# Patient Record
Sex: Female | Born: 1970 | Race: Black or African American | Hispanic: No | Marital: Single | Smoking: Never smoker
Health system: Southern US, Community
[De-identification: ages and names within clinical notes are randomized; demographics above are authoritative.]

## PROBLEM LIST (undated history)

## (undated) HISTORY — PX: CHOLECYSTECTOMY: SHX55

## (undated) HISTORY — PX: ABDOMINAL HYSTERECTOMY: SHX81

---

## 2016-04-20 ENCOUNTER — Other Ambulatory Visit: Payer: Self-pay | Admitting: Physical Medicine and Rehabilitation

## 2016-04-20 ENCOUNTER — Ambulatory Visit
Admission: RE | Admit: 2016-04-20 | Discharge: 2016-04-20 | Disposition: A | Discharge status: Home / Self Care | Payer: No Typology Code available for payment source | Source: Ambulatory Visit | Attending: Physical Medicine and Rehabilitation | Admitting: Physical Medicine and Rehabilitation

## 2016-04-20 DIAGNOSIS — Z0000 Encounter for general adult medical examination without abnormal findings: Secondary | ICD-10-CM

## 2017-10-18 ENCOUNTER — Emergency Department: Payer: 59

## 2017-10-18 ENCOUNTER — Emergency Department
Admission: EM | Admit: 2017-10-18 | Discharge: 2017-10-18 | Disposition: A | Discharge status: Home / Self Care | Payer: 59 | Attending: Emergency Medicine | Admitting: Emergency Medicine

## 2017-10-18 ENCOUNTER — Encounter: Payer: Self-pay | Admitting: Emergency Medicine

## 2017-10-18 DIAGNOSIS — R079 Chest pain, unspecified: Secondary | ICD-10-CM | POA: Diagnosis not present

## 2017-10-18 DIAGNOSIS — R0602 Shortness of breath: Secondary | ICD-10-CM | POA: Insufficient documentation

## 2017-10-18 LAB — CBC
HCT: 39.9 % (ref 35.0–47.0)
Hemoglobin: 13.4 g/dL (ref 12.0–16.0)
MCH: 29.5 pg (ref 26.0–34.0)
MCHC: 33.5 g/dL (ref 32.0–36.0)
MCV: 88.2 fL (ref 80.0–100.0)
Platelets: 266 10*3/uL (ref 150–440)
RBC: 4.53 MIL/uL (ref 3.80–5.20)
RDW: 13.5 % (ref 11.5–14.5)
WBC: 5.1 10*3/uL (ref 3.6–11.0)

## 2017-10-18 LAB — BASIC METABOLIC PANEL
ANION GAP: 9 (ref 5–15)
BUN: 13 mg/dL (ref 6–20)
CALCIUM: 9.3 mg/dL (ref 8.9–10.3)
CO2: 24 mmol/L (ref 22–32)
Chloride: 104 mmol/L (ref 101–111)
Creatinine, Ser: 0.87 mg/dL (ref 0.44–1.00)
Glucose, Bld: 92 mg/dL (ref 65–99)
POTASSIUM: 4.1 mmol/L (ref 3.5–5.1)
Sodium: 137 mmol/L (ref 135–145)

## 2017-10-18 LAB — TROPONIN I: Troponin I: <0.03 ng/mL (ref <0.03)

## 2017-10-18 MED ORDER — NITROGLYCERIN 0.4 MG SL SUBL
0.4000 mg | SUBLINGUAL_TABLET | Freq: Once | SUBLINGUAL | Status: AC
  Administered 2017-10-18: 0.4 mg via SUBLINGUAL
  Filled 2017-10-18: qty 1

## 2017-10-18 NOTE — ED Triage Notes (Signed)
Pt arrived with complaints of left sided chest pain that patient describes as a constant ache. Pt states she has had this pain for a month and due to a strong family history of heart problem came in for an evaluation.

## 2017-10-18 NOTE — ED Notes (Signed)
Pt presents today for the chest pain that has been there for 1 mth. Off and on pt states. Pt says it will come on when she is sitting still and when she is moving.Pt states it goes under the left breast and across the chest. EDP at bedside.

## 2017-10-18 NOTE — ED Provider Notes (Signed)
[facility] Emergency Department Provider Note  ____________________________________________   First MD Initiated Contact with Patient 10/18/17 1522     (approximate)  I have reviewed the triage vital signs and the nursing notes.   HISTORY  Chief Complaint Chest Pain   HPI Betty Mitchell is a 47 y.o. female with a family history of CAD who is presenting to the emergency department today with 1 month of intermittent chest pain.  Says the pain feels like a pressure and is a 7 out of 10 across the front of her chest at this time.  She says that the pain sometimes radiates into the left upper extremity and also to her bilateral shoulders.  She says the pain usually last 5-10 minutes at a time and is not necessarily associated with the shortness of breath.  She says that she does not get the pain with exertion and that it comes on randomly when she is sometimes at rest.  Says that the worst that the pain is a 9 out of 10 and sharp.  She denies any nausea or vomiting.  Does not report any diaphoresis.  Says that her family members were in their 70s when they required bypass surgery.  Patient denies smoking, drinking or drug use.  Says that she also has been generally fatigued over the past month.  Also reports a stress test that, according to care everywhere, was in 2016, and was normal.    History reviewed. No pertinent past medical history.  There are no active problems to display for this patient.   Past Surgical History:  Procedure Laterality Date  . ABDOMINAL HYSTERECTOMY    . CHOLECYSTECTOMY      Prior to Admission medications   Not on File    Allergies Dilaudid [hydromorphone hcl]  No family history on file.  Social History Social History   Tobacco Use  . Smoking status: Never Smoker  . Smokeless tobacco: Never Used  Substance Use Topics  . Alcohol use: Yes  . Drug use: No    Review of Systems  Constitutional: No fever/chills Eyes: No  visual changes. ENT: No sore throat. Cardiovascular: as above Respiratory: as above Gastrointestinal: No abdominal pain.  No nausea, no vomiting.  No diarrhea.  No constipation. Genitourinary: Negative for dysuria. Musculoskeletal: Negative for back pain. Skin: Negative for rash. Neurological: Negative for headaches, focal weakness or numbness.   ____________________________________________   PHYSICAL EXAM:  VITAL SIGNS: ED Triage Vitals  Enc Vitals Group     BP 10/18/17 1456 112/67     Pulse Rate 10/18/17 1456 (!) 58     Resp 10/18/17 1456 16     Temp 10/18/17 1456 98.4 F (36.9 C)     Temp Source 10/18/17 1456 Oral     SpO2 10/18/17 1456 100 %     Weight 10/18/17 1457 170 lb (77.1 kg)     Height 10/18/17 1457 5\' 1"  (1.549 m)     Head Circumference --      Peak Flow --      Pain Score 10/18/17 1457 7     Pain Loc --      Pain Edu? --      Excl. in GC? --     Constitutional: Alert and oriented. Well appearing and in no acute distress. Eyes: Conjunctivae are normal.  Head: Atraumatic. Nose: No congestion/rhinnorhea. Mouth/Throat: Mucous membranes are moist.  Neck: No stridor.   Cardiovascular: Normal rate, regular rhythm. Grossly normal heart sounds.  Good  peripheral circulation with equal and bilateral radial as well as dorsalis pedis pulses.  No reproducible tenderness to palpation over the chest wall. Respiratory: Normal respiratory effort.  No retractions. Lungs CTAB. Gastrointestinal: Soft and nontender. No distention.  Musculoskeletal: No lower extremity tenderness nor edema.  No joint effusions. Neurologic:  Normal speech and language. No gross focal neurologic deficits are appreciated. Skin:  Skin is warm, dry and intact. No rash noted. Psychiatric: Mood and affect are normal. Speech and behavior are normal.  ____________________________________________   LABS (all labs ordered are listed, but only abnormal results are displayed)  Labs Reviewed  BASIC  METABOLIC PANEL  CBC  TROPONIN I   ____________________________________________  EKG  ED ECG REPORT I, Arelia Longest, the attending physician, personally viewed and interpreted this ECG.   Date: 10/18/2017  EKG Time: 1452  Rate: 61  Rhythm: normal sinus rhythm  Axis: Normal  Intervals:none  ST&T Change: No ST segment elevation or depression.  No abnormal T wave inversion  ED ECG REPORT I, Arelia Longest, the attending physician, personally viewed and interpreted this ECG.   Date: 10/18/2017  EKG Time: 1452  Rate: 63  Rhythm: normal sinus rhythm  Axis: Normal  Intervals:none  ST&T Change: No ST segment elevation or depression.  No abnormal T wave inversion.   ____________________________________________  RADIOLOGY  No acute disease on the chest x-ray ____________________________________________   PROCEDURES  Procedure(s) performed:   Procedures  Critical Care performed:   ____________________________________________   INITIAL IMPRESSION / ASSESSMENT AND PLAN / ED COURSE  Pertinent labs & imaging results that were available during my care of the patient were reviewed by me and considered in my medical decision making (see chart for details).  Differential diagnosis includes, but is not limited to, ACS, aortic dissection, pulmonary embolism, cardiac tamponade, pneumothorax, pneumonia, pericarditis, myocarditis, GI-related causes including esophagitis/gastritis, and musculoskeletal chest wall pain.   As part of my medical decision making, I reviewed the following data within the electronic MEDICAL RECORD NUMBER Old chart reviewed and Notes from prior ED visits   ----------------------------------------- 4:20 PM on 10/18/2017 -----------------------------------------  Patient after nitro tab says that the tab mostly just "made her feel weird."  She says that it made her lightheaded and short of breath with a headache.  However, she still says that her chest  pressure is a 5 out of 10.  Very reassuring workup.  Nonexertional chest pain that is not relieved by nitroglycerin.  Patient says that the stress test that she took in 2016 was "normal."  She will be following up with her primary care doctor but I also told her I will be giving her the number for cardiology as it will be essential for her to follow-up within the next 48-72 hours in the office.  She says that she will call this afternoon to schedule follow-up appointments.  We also discussed return to the hospital for any worsening or concerning symptoms and she is understanding and willing to comply.  The patient says that she does take estradiol so this does make her a bit increased risk for pulmonary embolus.  However, she is had very reassuring vital signs.  No pleuritic chest pain.  No lower extremity edema.  Clinical presentation is overall inconsistent with pulmonary embolus.  Also with a heart score of 2.      ____________________________________________   FINAL CLINICAL IMPRESSION(S) / ED DIAGNOSES  Chest pain.  Shortness of breath.   NEW MEDICATIONS STARTED DURING THIS VISIT:  New Prescriptions   No medications on file     Note:  This document was prepared using Dragon voice recognition software and may include unintentional dictation errors.     Myrna BlazerSchaevitz, Joniah Bednarski Matthew, MD 10/18/17 [id number]

## 2018-07-10 NOTE — Progress Notes (Deleted)
Last pap  Mammogram   

## 2018-07-18 ENCOUNTER — Other Ambulatory Visit: Payer: Self-pay | Admitting: Physician Assistant

## 2018-07-18 ENCOUNTER — Encounter: Payer: 59 | Admitting: Obstetrics and Gynecology

## 2018-07-18 ENCOUNTER — Other Ambulatory Visit: Payer: Self-pay | Admitting: Psychiatry

## 2018-07-18 DIAGNOSIS — Z1231 Encounter for screening mammogram for malignant neoplasm of breast: Secondary | ICD-10-CM

## 2018-08-06 ENCOUNTER — Ambulatory Visit
Admission: RE | Admit: 2018-08-06 | Discharge: 2018-08-06 | Disposition: A | Discharge status: Home / Self Care | Payer: 59 | Source: Ambulatory Visit | Attending: Physician Assistant | Admitting: Physician Assistant

## 2018-08-06 DIAGNOSIS — Z1231 Encounter for screening mammogram for malignant neoplasm of breast: Secondary | ICD-10-CM

## 2018-08-07 ENCOUNTER — Other Ambulatory Visit: Payer: Self-pay | Admitting: Physician Assistant

## 2018-08-07 DIAGNOSIS — R928 Other abnormal and inconclusive findings on diagnostic imaging of breast: Secondary | ICD-10-CM

## 2018-08-09 ENCOUNTER — Ambulatory Visit
Admission: RE | Admit: 2018-08-09 | Discharge: 2018-08-09 | Disposition: A | Discharge status: Home / Self Care | Payer: 59 | Source: Ambulatory Visit | Attending: Physician Assistant | Admitting: Physician Assistant

## 2018-08-09 DIAGNOSIS — R928 Other abnormal and inconclusive findings on diagnostic imaging of breast: Secondary | ICD-10-CM

## 2019-07-07 ENCOUNTER — Other Ambulatory Visit: Payer: Self-pay | Admitting: Orthopaedic Surgery

## 2019-07-07 DIAGNOSIS — Z1231 Encounter for screening mammogram for malignant neoplasm of breast: Secondary | ICD-10-CM

## 2019-08-26 ENCOUNTER — Ambulatory Visit
Admission: RE | Admit: 2019-08-26 | Discharge: 2019-08-26 | Disposition: A | Discharge status: Home / Self Care | Payer: 59 | Source: Ambulatory Visit | Attending: Orthopaedic Surgery | Admitting: Orthopaedic Surgery

## 2019-08-26 ENCOUNTER — Other Ambulatory Visit: Payer: Self-pay

## 2019-08-26 DIAGNOSIS — Z1231 Encounter for screening mammogram for malignant neoplasm of breast: Secondary | ICD-10-CM

## 2019-10-28 IMAGING — MG DIGITAL DIAGNOSTIC UNILATERAL RIGHT MAMMOGRAM WITH TOMO AND CAD
4 series · 4 of 12 positions shown · non-contrast
Comparison: Previous exam(s).

CLINICAL DATA: Patient recalled from screening for right breast
mass.

EXAM:
DIGITAL DIAGNOSTIC RIGHT MAMMOGRAM WITH CAD AND TOMO
ULTRASOUND RIGHT BREAST

[R ML synth-2D]
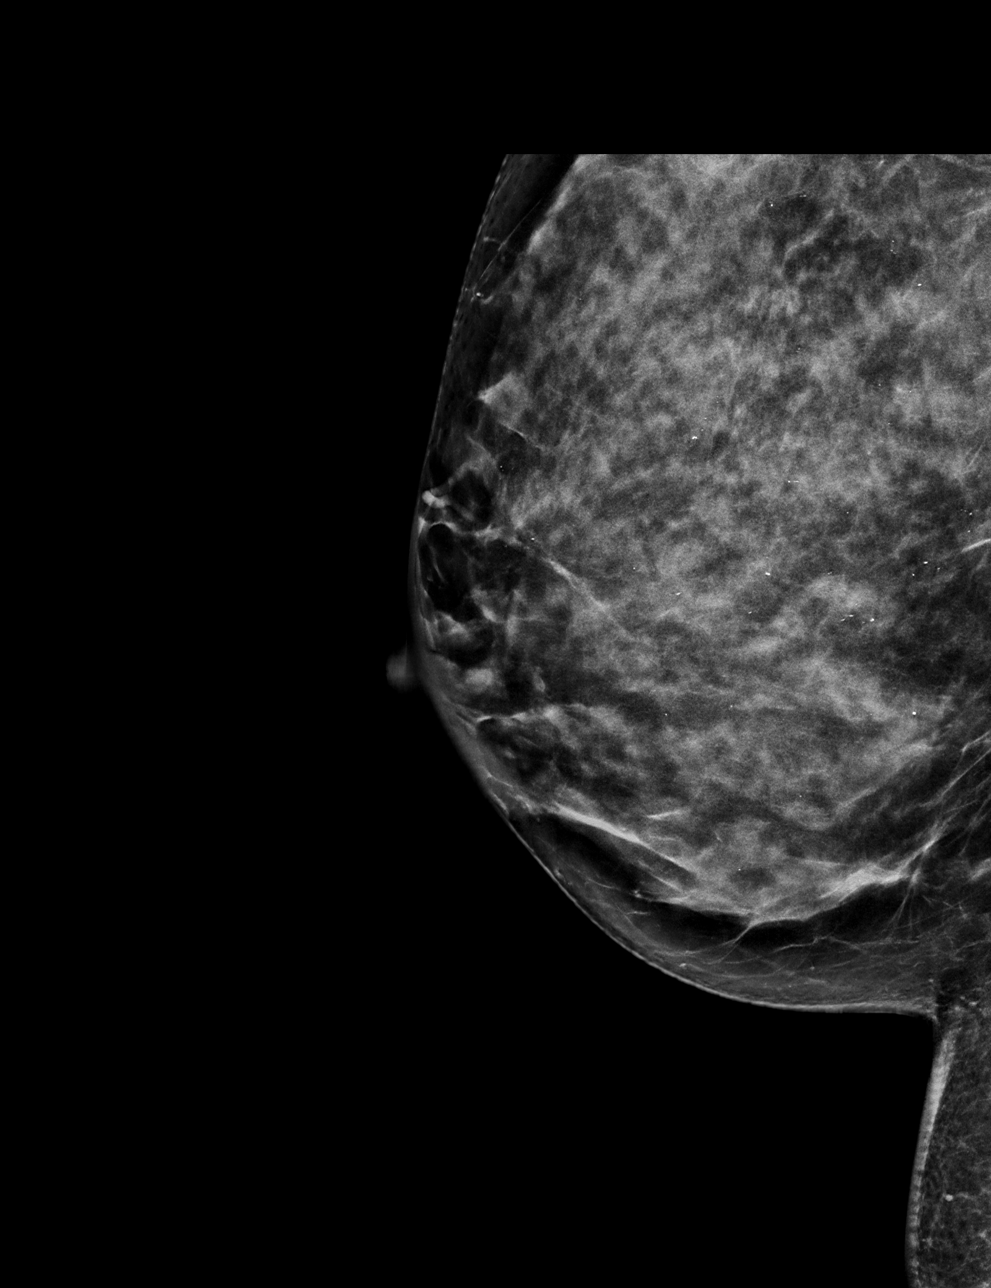

[R CC synth-2D]
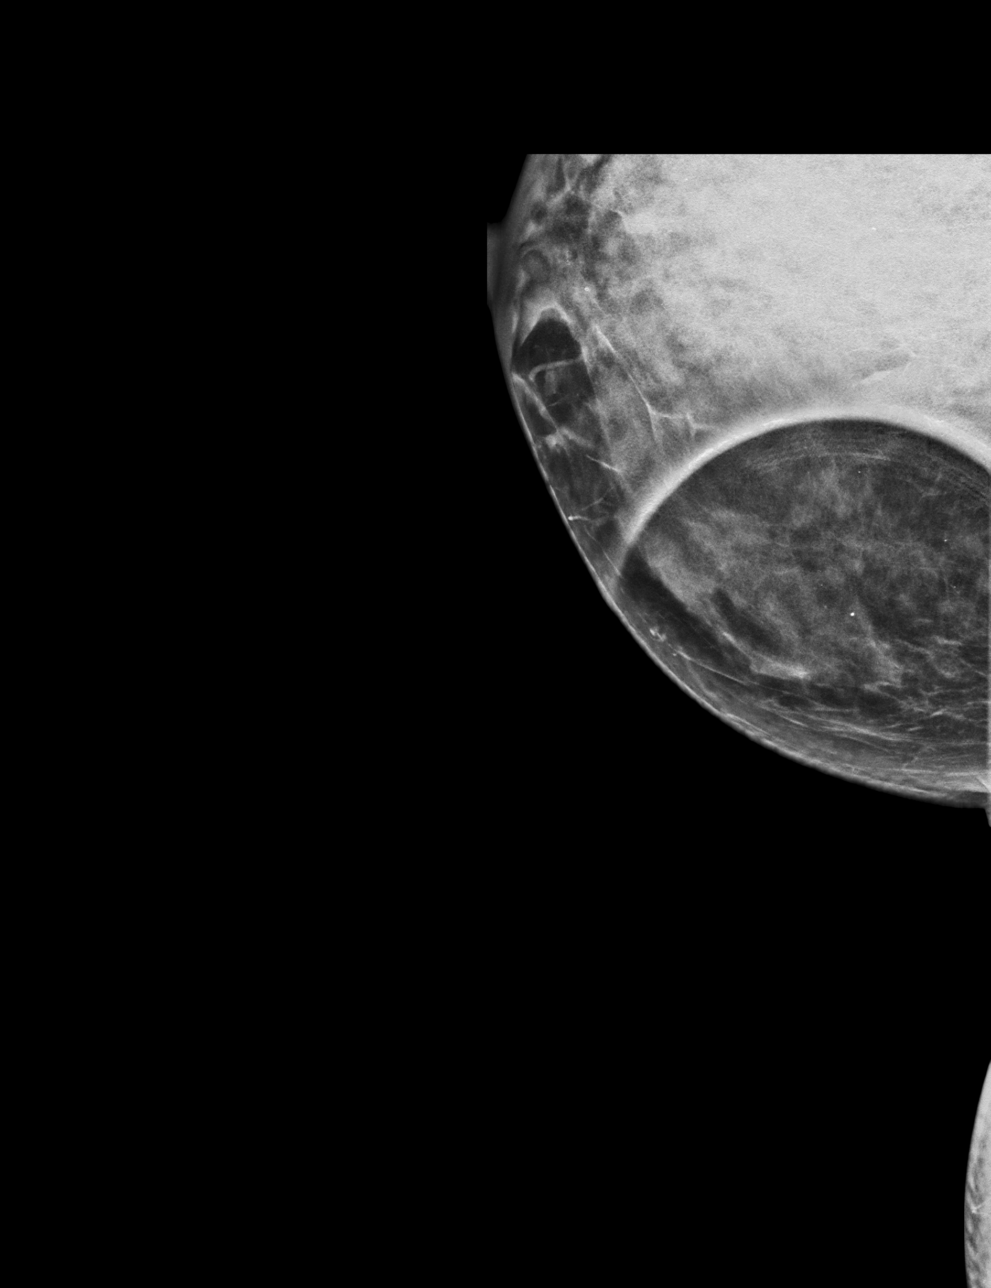

[R CC tomo · tomo slice 25/49.0]
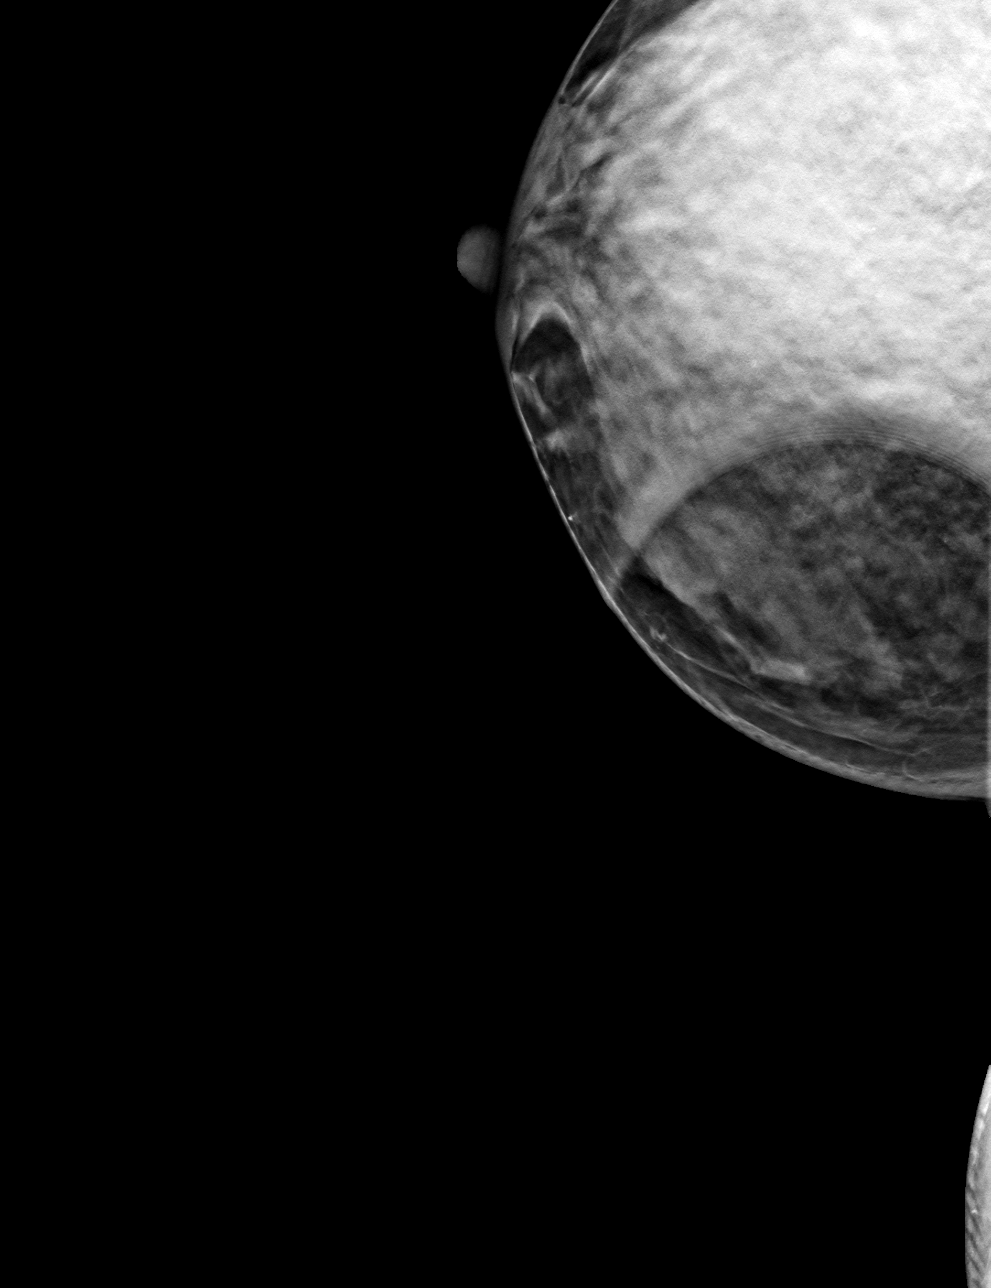

[R ML tomo · tomo slice 31/61.0]
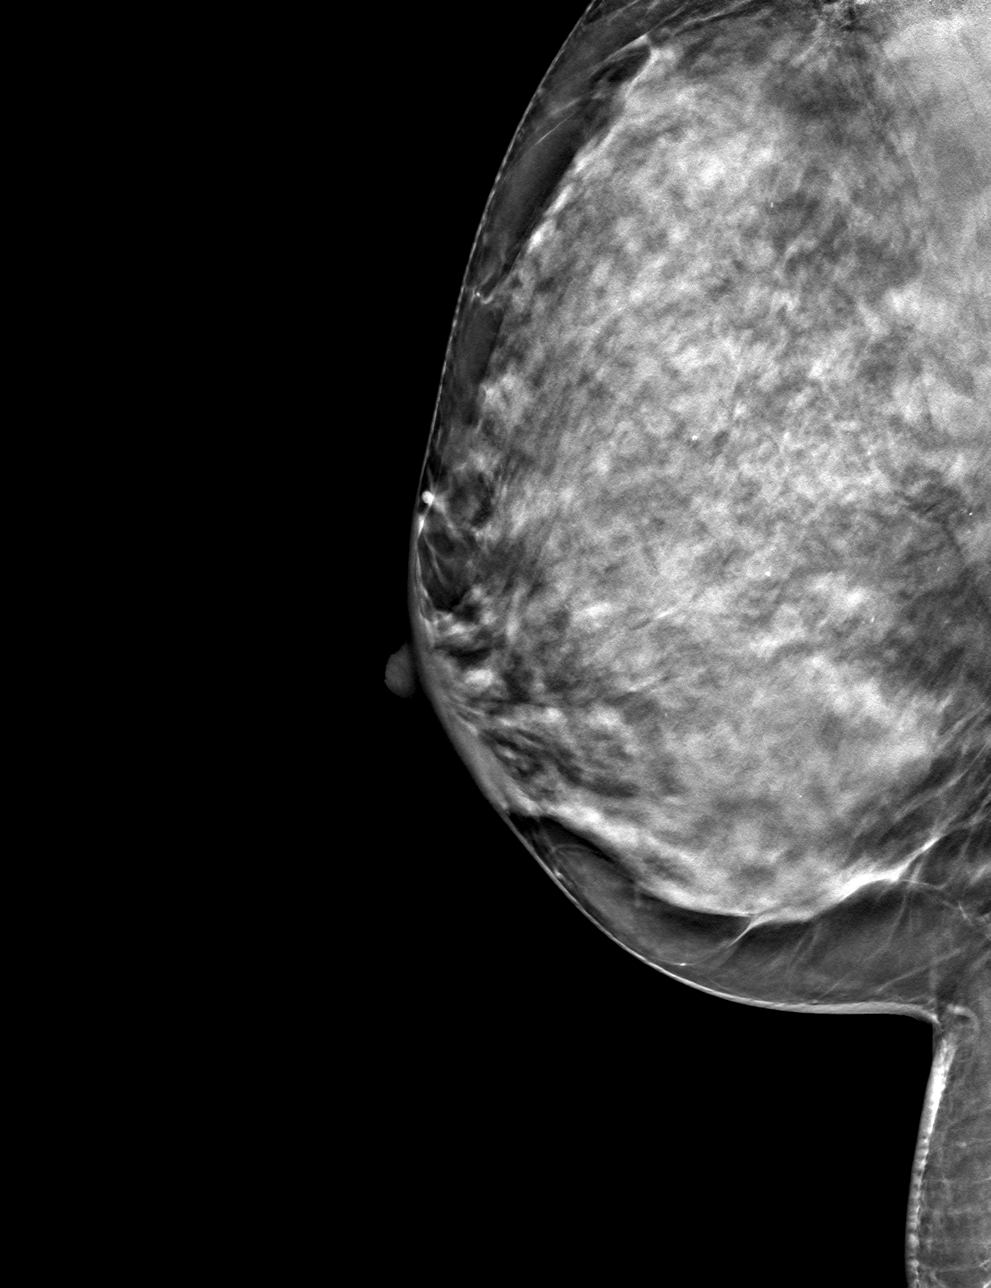

[4 of 12 positions shown; findings below may reference images not displayed]

ACR Breast Density Category d: The breast tissue is extremely dense,
which lowers the sensitivity of mammography.
FINDINGS: Spot-compression CC tomosynthesis images of the right breast
demonstrate the questioned mass to efface within the medial right
breast. There is residual dense tissue in this location.

Mammographic images were processed with CAD.

On physical exam, I palpate no discrete mass within the medial right
breast.

Targeted ultrasound is performed, showing dense tissue within the
medial right breast. Additionally there is a 5 x 3 x 7 mm cyst right
breast 2 o'clock position 5 cm from nipple, felt to correspond with
mammographically identified mass.
IMPRESSION: Right breast cyst.

No mammographic evidence for malignancy.

RECOMMENDATION:
Screening mammogram in one year.(Code:X0-M-PED)

I have discussed the findings and recommendations with the patient.
Results were also provided in writing at the conclusion of the
visit. If applicable, a reminder letter will be sent to the patient
regarding the next appointment.

BI-RADS CATEGORY  2: Benign.

## 2019-10-28 IMAGING — US ULTRASOUND RIGHT BREAST LIMITED
1 series · 4 of 4 positions shown · non-contrast
Comparison: Previous exam(s).

CLINICAL DATA: Patient recalled from screening for right breast
mass.

EXAM:
DIGITAL DIAGNOSTIC RIGHT MAMMOGRAM WITH CAD AND TOMO
ULTRASOUND RIGHT BREAST

[Series 1: ultrasound right breast limited · 0.06mm/px · 4 of 4 slices shown]
[im 1/4]
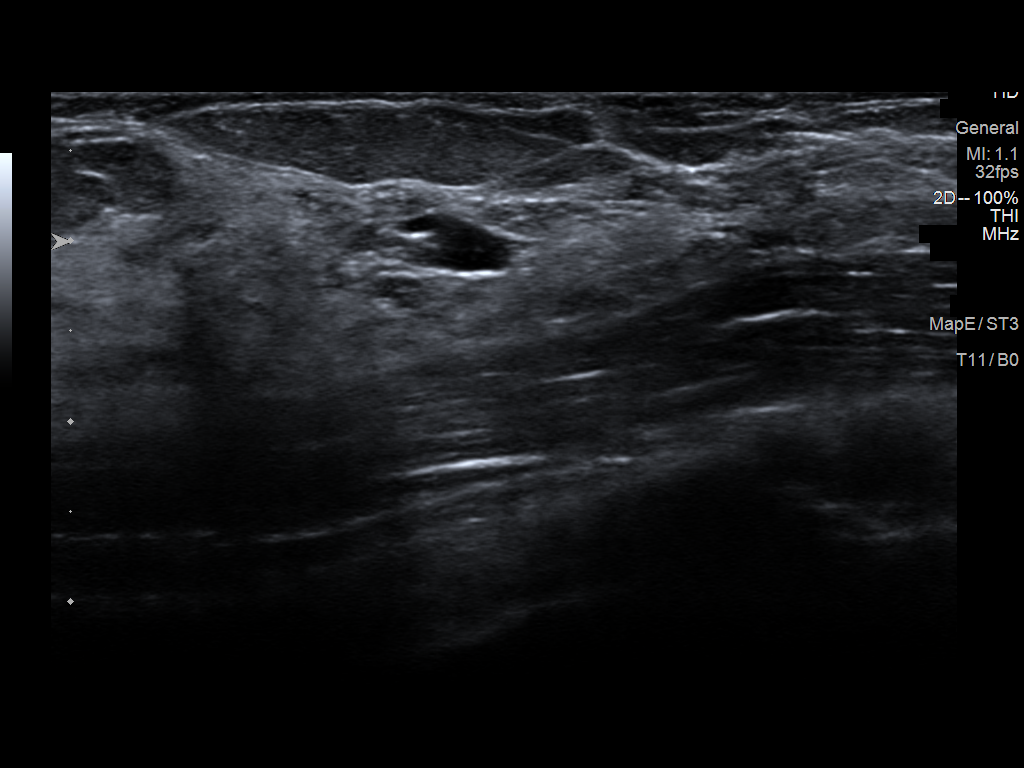
[im 2/4]
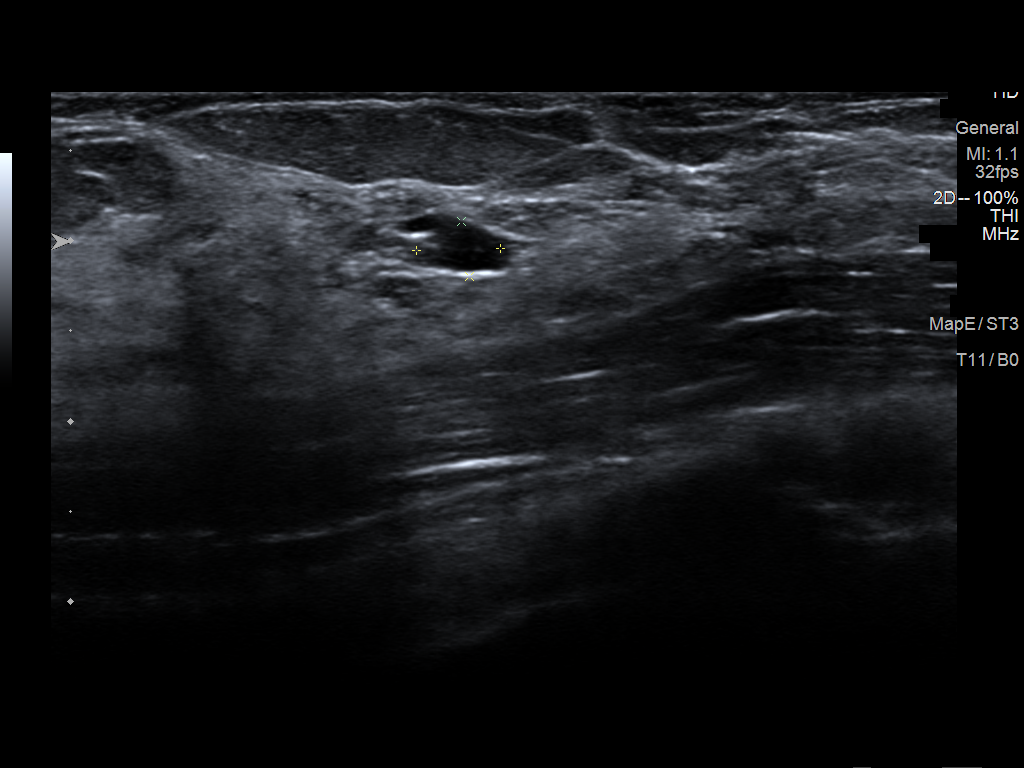
[im 3/4]
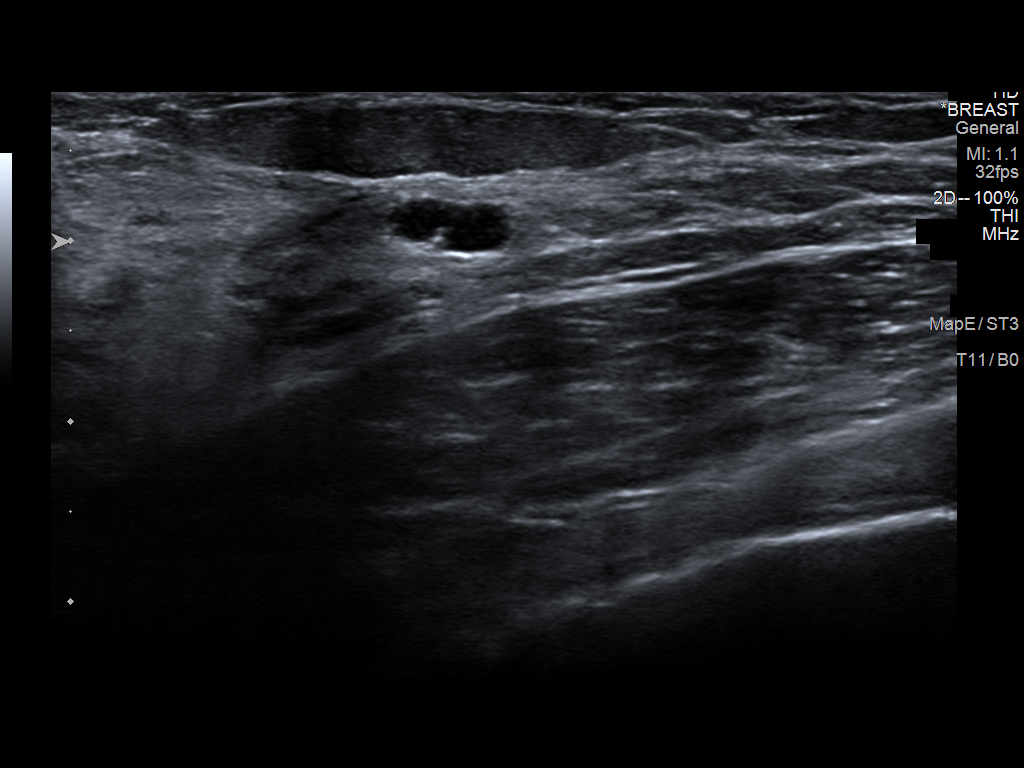
[im 4/4]
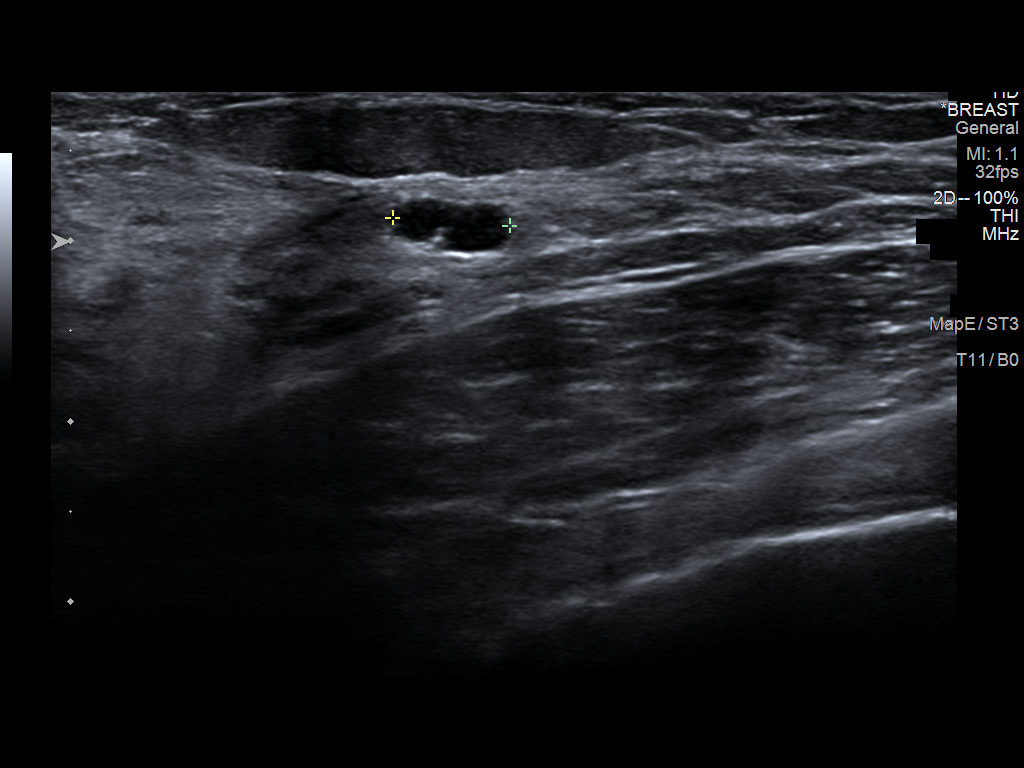

[4 of 4 positions shown; findings below may reference images not displayed]

ACR Breast Density Category d: The breast tissue is extremely dense,
which lowers the sensitivity of mammography.
FINDINGS: Spot-compression CC tomosynthesis images of the right breast
demonstrate the questioned mass to efface within the medial right
breast. There is residual dense tissue in this location.

Mammographic images were processed with CAD.

On physical exam, I palpate no discrete mass within the medial right
breast.

Targeted ultrasound is performed, showing dense tissue within the
medial right breast. Additionally there is a 5 x 3 x 7 mm cyst right
breast 2 o'clock position 5 cm from nipple, felt to correspond with
mammographically identified mass.
IMPRESSION: Right breast cyst.

No mammographic evidence for malignancy.

RECOMMENDATION:
Screening mammogram in one year.(Code:X0-M-PED)

I have discussed the findings and recommendations with the patient.
Results were also provided in writing at the conclusion of the
visit. If applicable, a reminder letter will be sent to the patient
regarding the next appointment.

BI-RADS CATEGORY  2: Benign.

## 2020-07-29 ENCOUNTER — Other Ambulatory Visit: Payer: Self-pay | Admitting: Student in an Organized Health Care Education/Training Program

## 2020-07-29 DIAGNOSIS — Z1231 Encounter for screening mammogram for malignant neoplasm of breast: Secondary | ICD-10-CM

## 2020-09-09 ENCOUNTER — Other Ambulatory Visit: Payer: Self-pay

## 2020-09-09 ENCOUNTER — Ambulatory Visit
Admission: RE | Admit: 2020-09-09 | Discharge: 2020-09-09 | Disposition: A | Discharge status: Home / Self Care | Payer: 59 | Source: Ambulatory Visit | Attending: Student in an Organized Health Care Education/Training Program | Admitting: Student in an Organized Health Care Education/Training Program

## 2020-09-09 DIAGNOSIS — Z1231 Encounter for screening mammogram for malignant neoplasm of breast: Secondary | ICD-10-CM

## 2021-07-15 ENCOUNTER — Other Ambulatory Visit: Payer: Self-pay | Admitting: Occupational Medicine

## 2021-07-15 ENCOUNTER — Ambulatory Visit: Payer: Self-pay

## 2021-07-15 ENCOUNTER — Other Ambulatory Visit: Payer: Self-pay

## 2021-07-15 DIAGNOSIS — Z0000 Encounter for general adult medical examination without abnormal findings: Secondary | ICD-10-CM

## 2021-08-01 ENCOUNTER — Other Ambulatory Visit: Payer: Self-pay | Admitting: Student in an Organized Health Care Education/Training Program

## 2021-08-01 DIAGNOSIS — Z1231 Encounter for screening mammogram for malignant neoplasm of breast: Secondary | ICD-10-CM

## 2021-09-15 ENCOUNTER — Other Ambulatory Visit: Payer: Self-pay

## 2021-09-15 ENCOUNTER — Ambulatory Visit
Admission: RE | Admit: 2021-09-15 | Discharge: 2021-09-15 | Disposition: A | Discharge status: Home / Self Care | Payer: 59 | Source: Ambulatory Visit | Attending: Student in an Organized Health Care Education/Training Program | Admitting: Student in an Organized Health Care Education/Training Program

## 2021-09-15 DIAGNOSIS — Z1231 Encounter for screening mammogram for malignant neoplasm of breast: Secondary | ICD-10-CM

## 2022-08-29 ENCOUNTER — Other Ambulatory Visit: Payer: Self-pay | Admitting: Family Medicine

## 2022-08-29 DIAGNOSIS — Z1231 Encounter for screening mammogram for malignant neoplasm of breast: Secondary | ICD-10-CM

## 2022-10-16 ENCOUNTER — Ambulatory Visit
Admission: RE | Admit: 2022-10-16 | Discharge: 2022-10-16 | Disposition: A | Discharge status: Home / Self Care | Payer: 59 | Source: Ambulatory Visit | Attending: Family Medicine | Admitting: Family Medicine

## 2022-10-16 DIAGNOSIS — Z1231 Encounter for screening mammogram for malignant neoplasm of breast: Secondary | ICD-10-CM

## 2023-07-09 ENCOUNTER — Other Ambulatory Visit: Payer: Self-pay | Admitting: Family Medicine

## 2023-07-09 DIAGNOSIS — Z1231 Encounter for screening mammogram for malignant neoplasm of breast: Secondary | ICD-10-CM

## 2023-10-19 ENCOUNTER — Ambulatory Visit: Payer: Self-pay

## 2023-10-24 ENCOUNTER — Ambulatory Visit: Payer: 59

## 2023-10-25 ENCOUNTER — Ambulatory Visit
Admission: RE | Admit: 2023-10-25 | Discharge: 2023-10-25 | Disposition: A | Discharge status: Home / Self Care | Payer: 59 | Source: Ambulatory Visit | Attending: Family Medicine | Admitting: Family Medicine

## 2023-10-25 DIAGNOSIS — Z1231 Encounter for screening mammogram for malignant neoplasm of breast: Secondary | ICD-10-CM

## 2024-07-14 ENCOUNTER — Encounter: Payer: Self-pay | Admitting: Family Medicine

## 2024-07-14 DIAGNOSIS — Z1231 Encounter for screening mammogram for malignant neoplasm of breast: Secondary | ICD-10-CM

## 2024-07-21 ENCOUNTER — Other Ambulatory Visit: Payer: Self-pay | Admitting: Family Medicine

## 2024-07-21 DIAGNOSIS — Z1231 Encounter for screening mammogram for malignant neoplasm of breast: Secondary | ICD-10-CM

## 2024-10-27 ENCOUNTER — Ambulatory Visit
